# Patient Record
Sex: Female | Born: 1980 | Hispanic: No | Marital: Married | Smoking: Never smoker
Health system: Southern US, Community
[De-identification: ages and names within clinical notes are randomized; demographics above are authoritative.]

## PROBLEM LIST (undated history)

## (undated) DIAGNOSIS — F329 Major depressive disorder, single episode, unspecified: Secondary | ICD-10-CM

## (undated) DIAGNOSIS — N926 Irregular menstruation, unspecified: Secondary | ICD-10-CM

## (undated) DIAGNOSIS — F32A Depression, unspecified: Secondary | ICD-10-CM

## (undated) DIAGNOSIS — F419 Anxiety disorder, unspecified: Secondary | ICD-10-CM

## (undated) HISTORY — DX: Depression, unspecified: F32.A

## (undated) HISTORY — DX: Major depressive disorder, single episode, unspecified: F32.9

## (undated) HISTORY — DX: Anxiety disorder, unspecified: F41.9

## (undated) HISTORY — DX: Irregular menstruation, unspecified: N92.6

---

## 2014-01-16 ENCOUNTER — Ambulatory Visit (INDEPENDENT_AMBULATORY_CARE_PROVIDER_SITE_OTHER): Payer: 59 | Admitting: Family Medicine

## 2014-01-16 ENCOUNTER — Encounter: Payer: Self-pay | Admitting: Family Medicine

## 2014-01-16 VITALS — BP 110/82 | HR 71 | Temp 98.2°F | Resp 16 | Ht 60.0 in | Wt 202.8 lb

## 2014-01-16 DIAGNOSIS — Z808 Family history of malignant neoplasm of other organs or systems: Secondary | ICD-10-CM

## 2014-01-16 DIAGNOSIS — N926 Irregular menstruation, unspecified: Secondary | ICD-10-CM

## 2014-01-16 DIAGNOSIS — M62838 Other muscle spasm: Secondary | ICD-10-CM

## 2014-01-16 DIAGNOSIS — E669 Obesity, unspecified: Secondary | ICD-10-CM

## 2014-01-16 LAB — CBC WITH DIFFERENTIAL/PLATELET
BASOS ABS: 0 10*3/uL (ref 0.0–0.1)
BASOS PCT: 0 % (ref 0–1)
EOS PCT: 2 % (ref 0–5)
Eosinophils Absolute: 0.2 10*3/uL (ref 0.0–0.7)
HCT: 41.4 % (ref 36.0–46.0)
Hemoglobin: 13.9 g/dL (ref 12.0–15.0)
Lymphocytes Relative: 39 % (ref 12–46)
Lymphs Abs: 3.5 10*3/uL (ref 0.7–4.0)
MCH: 28.7 pg (ref 26.0–34.0)
MCHC: 33.6 g/dL (ref 30.0–36.0)
MCV: 85.4 fL (ref 78.0–100.0)
MONOS PCT: 7 % (ref 3–12)
Monocytes Absolute: 0.6 10*3/uL (ref 0.1–1.0)
Neutro Abs: 4.6 10*3/uL (ref 1.7–7.7)
Neutrophils Relative %: 52 % (ref 43–77)
Platelets: 357 10*3/uL (ref 150–400)
RBC: 4.85 MIL/uL (ref 3.87–5.11)
RDW: 13.6 % (ref 11.5–15.5)
WBC: 8.9 10*3/uL (ref 4.0–10.5)

## 2014-01-16 NOTE — Patient Instructions (Signed)
I have ordered a limited pelvic ultrasound. I have also ordered a referral to [facility] for massage therapy and nutritional services to help with weight loss. I will see you again in 4-6 weeks. You will have your labs results by Monday (if you sign up for MyChart).

## 2014-01-17 DIAGNOSIS — E669 Obesity, unspecified: Secondary | ICD-10-CM | POA: Insufficient documentation

## 2014-01-17 DIAGNOSIS — Z808 Family history of malignant neoplasm of other organs or systems: Secondary | ICD-10-CM | POA: Insufficient documentation

## 2014-01-17 DIAGNOSIS — N926 Irregular menstruation, unspecified: Secondary | ICD-10-CM | POA: Insufficient documentation

## 2014-01-17 LAB — COMPLETE METABOLIC PANEL WITH GFR
ALBUMIN: 4.2 g/dL (ref 3.5–5.2)
ALK PHOS: 50 U/L (ref 39–117)
ALT: 43 U/L — AB (ref 0–35)
AST: 27 U/L (ref 0–37)
BUN: 8 mg/dL (ref 6–23)
CO2: 27 meq/L (ref 19–32)
Calcium: 9.6 mg/dL (ref 8.4–10.5)
Chloride: 101 mEq/L (ref 96–112)
Creat: 0.58 mg/dL (ref 0.50–1.10)
GFR, Est African American: >89 mL/min
GLUCOSE: 83 mg/dL (ref 70–99)
POTASSIUM: 4 meq/L (ref 3.5–5.3)
SODIUM: 138 meq/L (ref 135–145)
TOTAL PROTEIN: 7.4 g/dL (ref 6.0–8.3)
Total Bilirubin: 0.5 mg/dL (ref 0.2–1.2)

## 2014-01-17 LAB — THYROID PANEL WITH TSH
Free Thyroxine Index: 3.5 (ref 1.0–3.9)
T3 Uptake: 31.8 % (ref 22.5–37.0)
T4, Total: 11 ug/dL (ref 5.0–12.5)
TSH: 1.198 u[IU]/mL (ref 0.350–4.500)

## 2014-01-17 LAB — FOLLICLE STIMULATING HORMONE: FSH: 5.1 m[IU]/mL

## 2014-01-17 LAB — LUTEINIZING HORMONE: LH: 10.2 m[IU]/mL

## 2014-01-17 LAB — VITAMIN D 25 HYDROXY (VIT D DEFICIENCY, FRACTURES): Vit D, 25-Hydroxy: 31 ng/mL (ref 30–89)

## 2014-01-17 NOTE — Progress Notes (Signed)
Subjective:    Patient ID: Sandra Knapp, female    DOB: 06/02/1981, 33 y.o.   MRN: [id number]  HPI  This 33 y.o. Asian female is here to establish care; [place]. They moved for employment opportunities and enjoy living here. Pt is in good health but has hx of irreg menses and amenorrhea, onset at age 33. Menarche at age 33 and regular until age 33. She last missed a period > 12 months ago; PCP prescribed Provera which pt tok for 10 days. Menses resumed but still irreg and often 5-7 days. Mild dysmenorrhea. Pt has never had a PAP, stating that "it is a cultural thing". She is married and is sexually active. She would like to have further evaluation of irreg menses w/o pelvic exam/PAP.  Pt works in [employer] and c/o neck and shoulder muscle spasms. Her work station is not ergonomically suitable for her due to her short stature. She requests referral for "medical massage".  Pt also has hx of anxiety and depression, not currently on medication.   Review of Systems  Constitutional:       Difficulty w/ weight loss.  HENT: Negative.   Eyes: Negative.   Respiratory: Negative.   Cardiovascular: Negative.   Endocrine: Negative.   Genitourinary: Positive for menstrual problem. Negative for flank pain, vaginal discharge, vaginal pain, pelvic pain and dyspareunia.  Musculoskeletal: Positive for myalgias and neck stiffness. Negative for arthralgias, back pain and gait problem.  Skin: Negative.   Neurological: Negative.   Hematological: Negative.   Psychiatric/Behavioral: Negative for behavioral problems, sleep disturbance, dysphoric mood and agitation. The patient is nervous/anxious.       Objective:   Physical Exam  Nursing note and vitals reviewed. Constitutional: She is oriented to person, place, and time. Vital signs are normal. She appears well-developed and well-nourished. No distress.  HENT:  Head: Normocephalic and atraumatic.    Right Ear: Hearing, tympanic membrane, external ear and ear canal normal.  Left Ear: Hearing, tympanic membrane, external ear and ear canal normal.  Nose: Nose normal. No nasal deformity or septal deviation.  Mouth/Throat: Uvula is midline, oropharynx is clear and moist and mucous membranes are normal. No oral lesions. Normal dentition.  Eyes: Conjunctivae, EOM and lids are normal. Pupils are equal, round, and reactive to light. No scleral icterus.  Fundoscopic exam:      The right eye shows no arteriolar narrowing, no AV nicking and no papilledema. The right eye shows red reflex.       The left eye shows no arteriolar narrowing, no AV nicking and no papilledema. The left eye shows red reflex.  Neck: Trachea normal, normal range of motion and full passive range of motion without pain. Neck supple. Muscular tenderness present. No spinous process tenderness present. Normal range of motion present. No mass and no thyromegaly present.  Cardiovascular: Normal rate, regular rhythm, S1 normal, S2 normal and normal heart sounds.   No extrasystoles are present. Exam reveals no gallop and no friction rub.   No murmur heard. Pulmonary/Chest: Effort normal and breath sounds normal. No respiratory distress. She has no decreased breath sounds. She has no wheezes.  Abdominal: Soft. Normal appearance and bowel sounds are normal. She exhibits no distension, no pulsatile midline mass and no mass. There is no hepatosplenomegaly. There is no tenderness. There is no guarding and no CVA tenderness.  Musculoskeletal: Normal range of motion. She exhibits no edema and no tenderness.  Neurological:  She is alert and oriented to person, place, and time. She has normal strength. She displays no atrophy and no tremor. No cranial nerve deficit or sensory deficit. She exhibits normal muscle tone. Coordination and gait normal.  Reflex Scores:      Tricep reflexes are 1+ on the right side and 1+ on the left side.      Bicep  reflexes are 2+ on the right side and 2+ on the left side.      Brachioradialis reflexes are 1+ on the right side and 1+ on the left side.      Patellar reflexes are 2+ on the right side and 2+ on the left side. Skin: Skin is warm, dry and intact. No ecchymosis, no lesion and no rash noted. She is not diaphoretic. No cyanosis or erythema. No pallor. Nails show no clubbing.  Psychiatric: Her speech is normal and behavior is normal. Judgment and thought content normal. Her mood appears anxious. Her affect is not blunt, not labile and not inappropriate. Cognition and memory are normal. She does not exhibit a depressed mood.       Assessment & Plan:  Irregular menses - Plan: COMPLETE METABOLIC PANEL WITH GFR, CBC with Differential, Vitamin D, 25-hydroxy, Thyroid Panel With TSH, Follicle stimulating hormone, Luteinizing hormone, US Pelvis Limited  Obesity, unspecified - Plan: COMPLETE METABOLIC PANEL WITH GFR, CBC with Differential, Vitamin D, 25-hydroxy, Thyroid Panel With TSH, Follicle stimulating hormone, Luteinizing hormone  Family history of thyroid cancer - Plan: Thyroid Panel With TSH, Follicle stimulating hormone, Luteinizing hormone  Cervical paraspinous muscle spasm - Plan: PT massage

## 2014-01-19 NOTE — Progress Notes (Signed)
Quick Note:  Please notify pt that results are normal.   Provide pt with copy of labs. ______ 

## 2014-01-20 ENCOUNTER — Other Ambulatory Visit: Payer: Self-pay | Admitting: Family Medicine

## 2014-01-20 ENCOUNTER — Telehealth: Payer: Self-pay | Admitting: Radiology

## 2014-01-20 NOTE — Telephone Encounter (Signed)
You have ordered Pelvic US for patient, but in order for this to be complete study, the radiologist recommends a transvaginal. Patient will not consent to pap smear, do you want to order transvaginal US? Pended order.

## 2014-01-21 ENCOUNTER — Encounter: Payer: Self-pay | Admitting: Radiology

## 2014-01-21 NOTE — Telephone Encounter (Signed)
Pt does not want a transvaginal pelvic ultrasound; that is why it needs to be a "limited" study.

## 2014-01-21 NOTE — Telephone Encounter (Signed)
Patient aware this will not be a complete study, will you have radiology proceed with the limited study?

## 2014-01-22 ENCOUNTER — Telehealth: Payer: Self-pay

## 2014-01-22 NOTE — Telephone Encounter (Signed)
What do you recommend for a medical massage?

## 2014-01-22 NOTE — Telephone Encounter (Signed)
Called patient to advise, she will call [facility]

## 2014-01-22 NOTE — Telephone Encounter (Signed)
I gave pt information about [facility] with suggestion that she call and inquire about their services and whether her insurance would cover medical massage. I placed the order for PT/ medical massage on 6/4 2015. It may take a few weeks. If she needs it ASAP, I suggest she contact [facility] (she has the phone #).

## 2014-01-22 NOTE — Telephone Encounter (Signed)
Pt would like to know how long should it take for her to get an appt for massage thearapy. Best# [phone]

## 2014-01-23 NOTE — Telephone Encounter (Signed)
Gave [facility] the information to continue with the limited study

## 2014-01-27 ENCOUNTER — Ambulatory Visit
Admission: RE | Admit: 2014-01-27 | Discharge: 2014-01-27 | Disposition: A | Discharge status: Home / Self Care | Payer: 59 | Source: Ambulatory Visit | Attending: Family Medicine | Admitting: Family Medicine

## 2014-01-27 ENCOUNTER — Other Ambulatory Visit: Payer: Self-pay | Admitting: Family Medicine

## 2014-01-27 DIAGNOSIS — N926 Irregular menstruation, unspecified: Secondary | ICD-10-CM

## 2014-02-26 ENCOUNTER — Ambulatory Visit: Payer: 59 | Admitting: Family Medicine

## 2014-03-26 ENCOUNTER — Ambulatory Visit (INDEPENDENT_AMBULATORY_CARE_PROVIDER_SITE_OTHER): Payer: 59 | Admitting: Family Medicine

## 2014-03-26 ENCOUNTER — Encounter: Payer: Self-pay | Admitting: Family Medicine

## 2014-03-26 VITALS — BP 116/84 | HR 68 | Temp 98.1°F | Resp 16 | Ht 60.5 in | Wt 199.0 lb

## 2014-03-26 DIAGNOSIS — N912 Amenorrhea, unspecified: Secondary | ICD-10-CM

## 2014-03-26 DIAGNOSIS — N911 Secondary amenorrhea: Secondary | ICD-10-CM

## 2014-03-26 MED ORDER — MEDROXYPROGESTERONE ACETATE 10 MG PO TABS: 10.0000 mg | ORAL_TABLET | Freq: Every day | ORAL | Status: DC

## 2014-03-26 NOTE — Patient Instructions (Signed)
Secondary Amenorrhea  Secondary amenorrhea is the stopping of menstrual flow for 3-6 months in a female who has previously had periods. There are many possible causes. Most of these causes are not serious. Usually, treating the underlying problem causing the loss of menses will return your periods to normal. CAUSES  Some common and uncommon causes of not menstruating include:  Malnutrition.  Low blood sugar (hypoglycemia).  Polycystic ovary disease.  Stress or fear.  Breastfeeding.  Hormone imbalance.  Ovarian failure.  Medicines.  Extreme obesity.  Cystic fibrosis.  Low body weight or drastic weight reduction from any cause.  Early menopause.  Removal of ovaries or uterus.  Contraceptives.  Illness.  Long-term (chronic) illnesses.  Cushing syndrome.  Thyroid problems.  Birth control pills, patches, or vaginal rings for birth control. RISK FACTORS You may be at greater risk of secondary amenorrhea if:  You have a family history of this condition.  You have an eating disorder.  You do athletic training. DIAGNOSIS  A diagnosis is made by your health care provider taking a medical history and doing a physical exam. This will include a pelvic exam to check for problems with your reproductive organs. Pregnancy must be ruled out. Often, numerous blood tests are done to measure different hormones in the body. Urine testing may be done. Specialized exams (ultrasound, CT scan, MRI, or hysteroscopy) may have to be done as well as measuring the body mass index (BMI). TREATMENT  Treatment depends on the cause of the amenorrhea. If an eating disorder is present, this can be treated with an adequate diet and therapy. Chronic illnesses may improve with treatment of the illness. Amenorrhea may be corrected with medicines, lifestyle changes, or surgery. If the amenorrhea cannot be corrected, it is sometimes possible to create a false menstruation with medicines. HOME CARE  INSTRUCTIONS  Maintain a healthy diet.  Manage weight problems.  Exercise regularly but not excessively.  Get adequate sleep.  Manage stress.  Be aware of changes in your menstrual cycle. Keep a record of when your periods occur. Note the date your period starts, how long it lasts, and any problems. SEEK MEDICAL CARE IF: Your symptoms do not get better with treatment. Document Released: 09/12/2006 Document Revised: 04/03/2013 Document Reviewed: 01/17/2013 [facility] Patient Information 2015 [employer] [place]. This information is not intended to replace advice given to you by your health care provider. Make sure you discuss any questions you have with your health care provider.   MEDICATION- PROVERA 10 mg  Take 1 tablet daily for 10 days; you should have some bleeding when you finish the pills.  It would be a good idea to have a GYNECOLOGIST evaluate you and discuss the best treatment plan going forward, especially if you are considering pregnancy. Treatment of this problem is important for bone health and other health issues.

## 2014-03-26 NOTE — Progress Notes (Signed)
S: This 33 y.o. Asian female returns to discuss pelvic US and further evaluation/treatment of secondary amenorrhea. Pt usually responds to Provera 10-day challenge. Menarche onset at age 33 and regular menses until age 33. Pt feels well otherwise; she has no abnormal weight change, fatigue, fever/chills, night sweats, vision disturbances, CP or palpitations, SOB or cough, GI problems, myalgias, rashes, bruising, HA, dizziness, weakness, numbness or syncope. She reports normal,sleep pattern. She does not exercise regularly.  Patient Active Problem List   Diagnosis Date Noted  . Irregular menses 01/17/2014  . Obesity, unspecified 01/17/2014  . Family history of thyroid cancer 01/17/2014    No Known Allergies   History reviewed. No pertinent past surgical history.  Family History  Problem Relation Age of Onset  . Cancer Mother     thyroid  . Diabetes Mother   . Hyperlipidemia Mother   . Hypertension Mother   . Hypertension Father   . Stroke Father     History   Social History  . Marital Status: Married    Spouse Name: N/A    Number of Children: N/A  . Years of Education: N/A   Occupational History  . Not on file.   Social History Main Topics  . Smoking status: Never Smoker   . Smokeless tobacco: Not on file  . Alcohol Use: No  . Drug Use: No  . Sexual Activity: Not on file   Other Topics Concern  . Not on file   Social History Narrative  . No narrative on file    ROS: AS per HPI.  O: Filed Vitals:   03/26/14 1049  BP: 116/84  Pulse: 68  Temp: 98.1 F (36.7 C)  Resp: 16   GEN: In NAD; WN,WD.  BMI= 38.22 kg/m2 HENT: [place]/AT; EOMI w/ clear conj/sclerae. Ext ears/nose/oroph normal. COR: RRR. No edema. LUNGS: Normal resp rate and effort. SKIN: W&D; intact. Mild erythema of facial area. No diaphoresis. MS: MAEs; no deformities or muscle atrophy. NEURO: A&O x 3; CNs intact. Gait normal. Nonfocal. PSYCH: Mood, behavior, speech, cognition and judgement  normal.   01/16/2014 US Pelvis Complete: Normal exam; normal uterus w/ 3.7 mm endometrium. Ovaries normal. No free fluid. (Sonohysterogram recommended if bleeding unresponsive to hormonal or medical therapy).  This was reviewed w/ the pt.   A/P: Secondary amenorrhea - Need to r/o prolactinoma and Cushing's Syndrome. Plan: Prolactin, ACTH; Provera challenge x 10 days.  Advised pt to consider GYN consultation for complete assessment and best long-term treatment plan. Pt is agreeable to this. I suggested she signup for MyChart and contact me to let me know the results of latest Provera challenge and consent to proceed w/ GYN referral.  Meds ordered this encounter  Medications  . medroxyPROGESTERone (PROVERA) 10 MG tablet    Sig: Take 1 tablet (10 mg total) by mouth daily.    Dispense:  10 tablet    Refill:  0

## 2014-03-27 LAB — PROLACTIN: Prolactin: 19.1 ng/mL

## 2014-03-28 LAB — ACTH: C206 ACTH: 11 pg/mL (ref 6–50)

## 2014-04-05 NOTE — Progress Notes (Signed)
Quick Note:  Please notify pt that results are normal.   Provide pt with copy of labs. ______ 

## 2014-04-06 ENCOUNTER — Encounter: Payer: Self-pay | Admitting: Family Medicine

## 2014-04-07 ENCOUNTER — Other Ambulatory Visit: Payer: Self-pay | Admitting: Family Medicine

## 2014-04-07 ENCOUNTER — Encounter: Payer: Self-pay | Admitting: Family Medicine

## 2014-04-08 NOTE — Telephone Encounter (Signed)
Do you want to give RFs? See pt note below.

## 2014-04-09 MED ORDER — MEDROXYPROGESTERONE ACETATE 10 MG PO TABS: 10.0000 mg | ORAL_TABLET | Freq: Every day | ORAL | Status: AC

## 2014-04-09 NOTE — Telephone Encounter (Signed)
I have refilled Provera 10 mg  #10 tabs for 2 additional months.

## 2016-05-28 IMAGING — US US PELVIS COMPLETE
1 series · 14 of 25 positions shown · non-contrast
Comparison: None.

CLINICAL DATA: IRREGULAR MENSES

EXAM:
TRANSABDOMINAL ULTRASOUND OF PELVIS
TECHNIQUE: Transabdominal ultrasound examination of the pelvis was performed
including evaluation of the uterus, ovaries, adnexal regions, and
pelvic cul-de-sac.

[Series 1: us pelvis complete · 0.31mm/px · 14 of 30 slices shown]
[im 1/30]
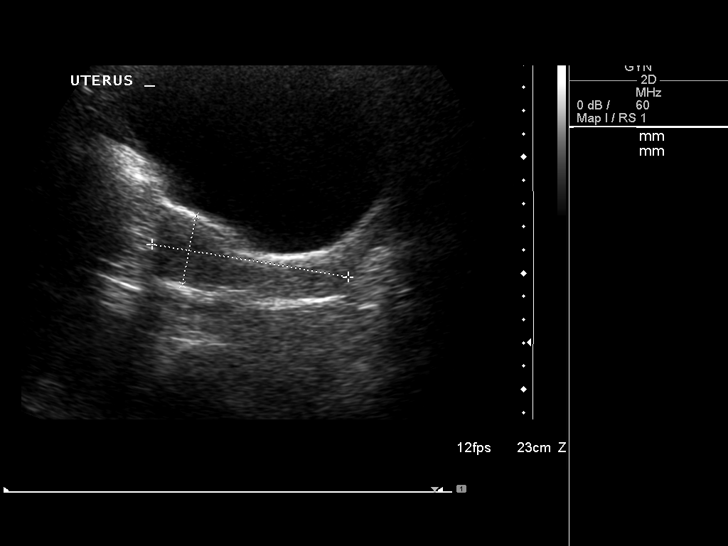
[im 3/30]
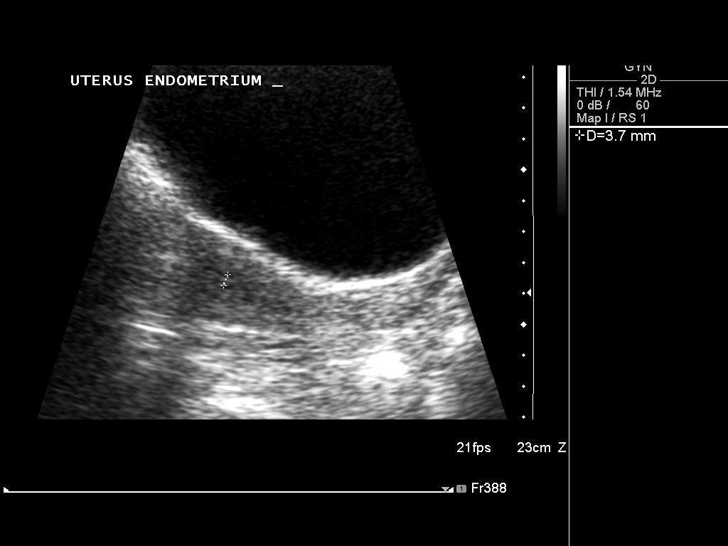
[im 5/30]
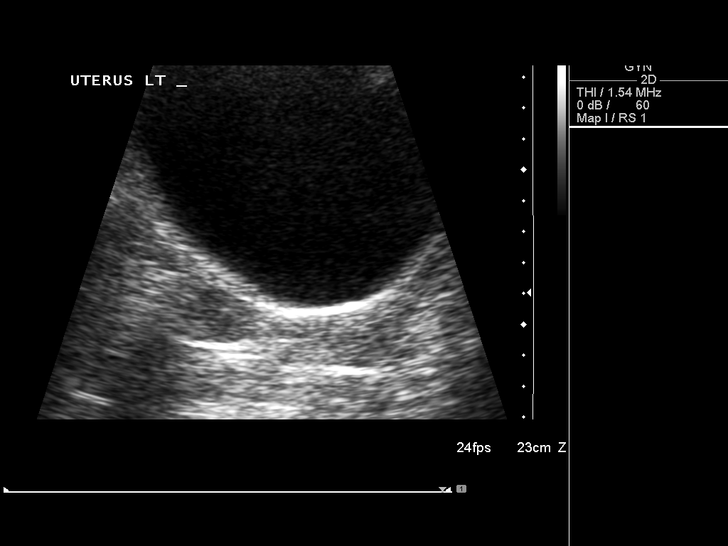
[im 8/30]
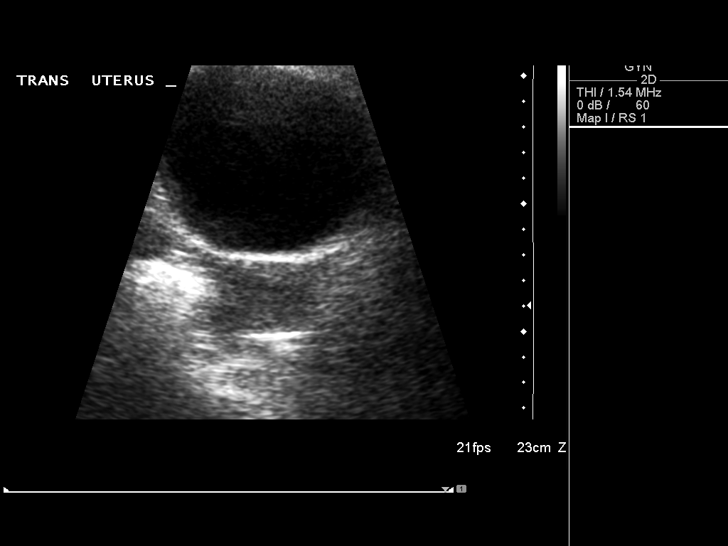
[im 10/30]
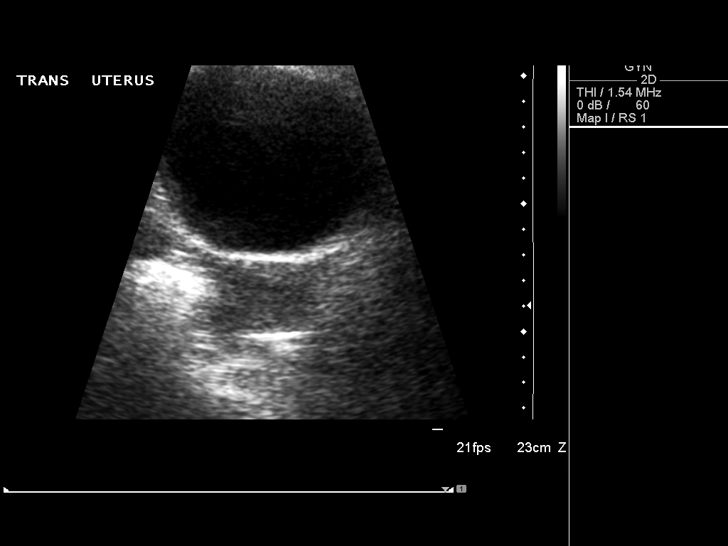
[im 11/30]
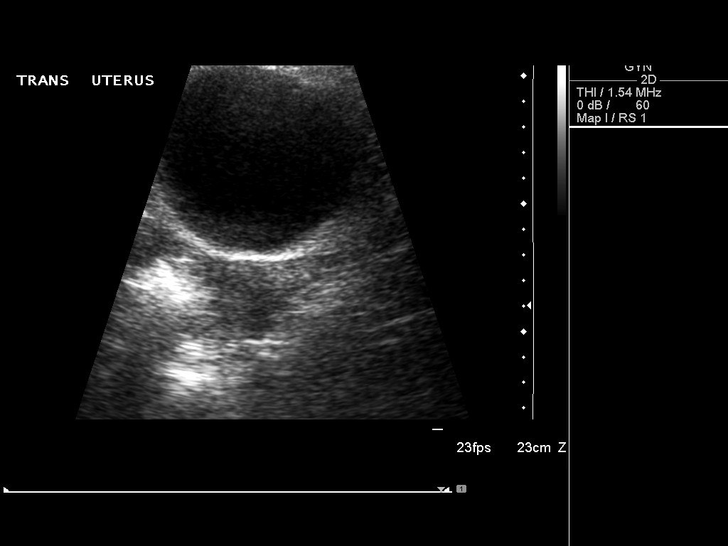
[im 14/30]
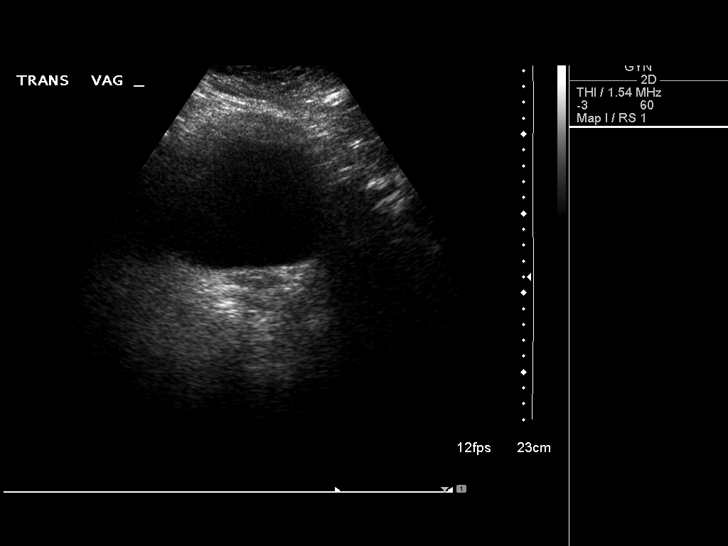
[im 16/30]
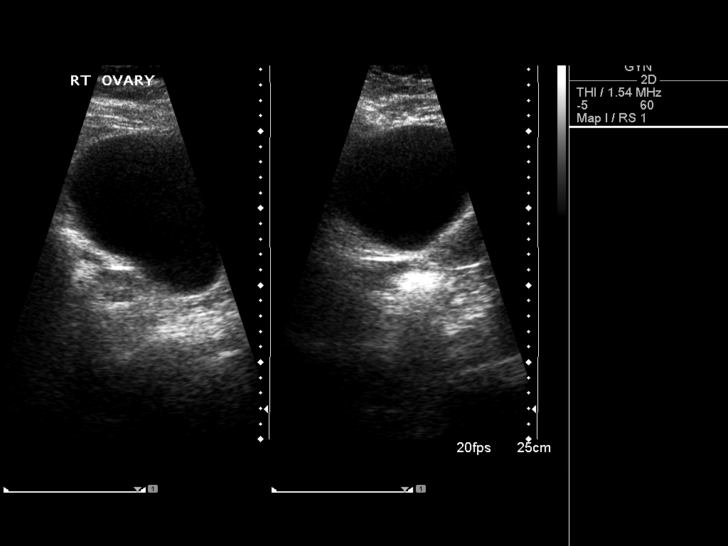
[im 19/30]
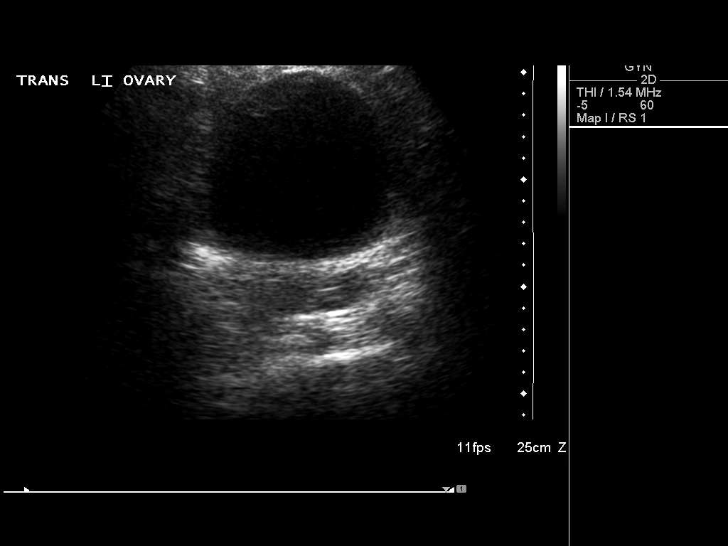
[im 20/30]
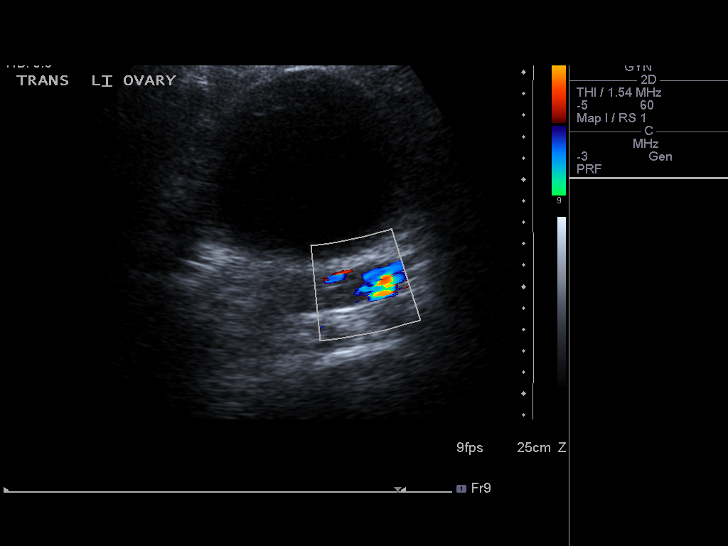
[im 22/30]
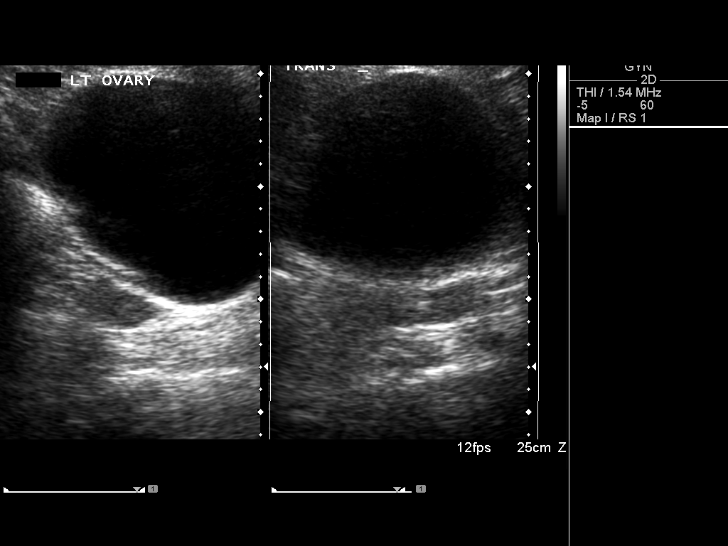
[im 25/30]
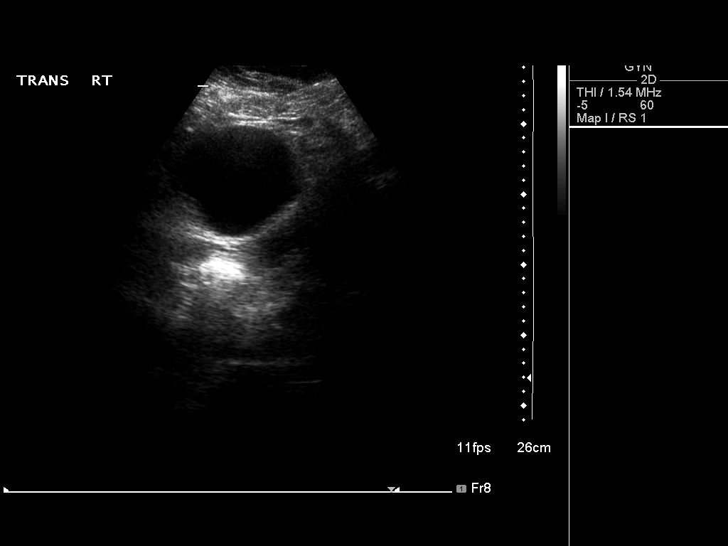
[im 27/30]
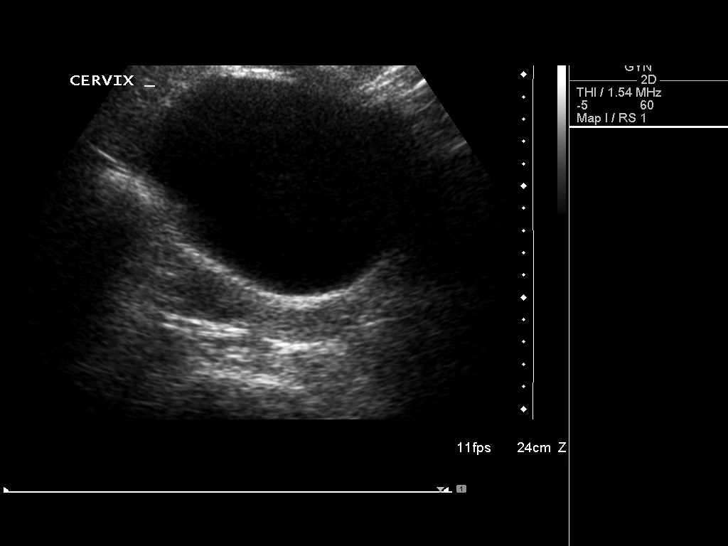
[im 30/30]
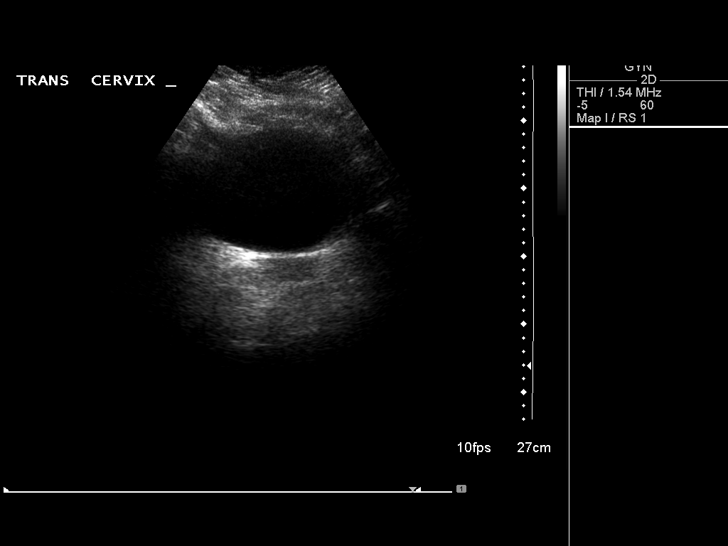

[14 of 25 positions shown; findings below may reference images not displayed]

FINDINGS: Uterus

Measurements: 8.6 x 3.0 x 4.0 cm. No fibroids or other mass
visualized.

Endometrium

Thickness: 3.7 mm..  No focal abnormality visualized.

Right ovary

Measurements: 2.7 x 2.5 x 3.1 cm. Normal appearance/no adnexal mass.

Left ovary

Measurements: 2.1 x 1.8 x 2.6 cm. Normal appearance/no adnexal mass.

Other findings:  No free fluid
IMPRESSION: 1. Normal exam.
2. If bleeding remains unresponsive to hormonal or medical therapy,
sonohysterogram should be considered for focal lesion work-up. (Ref:
Radiological Reasoning: Algorithmic Workup of Abnormal Vaginal
Bleeding with Endovaginal Sonography and Sonohysterography. AJR
[id number]; 191:[id number])

## 2022-01-13 DEATH — deceased
